# Patient Record
Sex: Male | Born: 1985 | Race: Black or African American | Hispanic: No | Marital: Single | Smoking: Former smoker
Health system: Southern US, Community
[De-identification: ages and names within clinical notes are randomized; demographics above are authoritative.]

---

## 2007-03-24 ENCOUNTER — Emergency Department (HOSPITAL_COMMUNITY): Admission: EM | Admit: 2007-03-24 | Discharge: 2007-03-24 | Payer: Self-pay | Admitting: Emergency Medicine

## 2007-03-29 ENCOUNTER — Emergency Department (HOSPITAL_COMMUNITY): Admission: EM | Admit: 2007-03-29 | Discharge: 2007-03-29 | Payer: Self-pay | Admitting: Family Medicine

## 2011-08-30 ENCOUNTER — Encounter (HOSPITAL_COMMUNITY): Payer: Self-pay | Admitting: Emergency Medicine

## 2011-08-30 ENCOUNTER — Emergency Department (HOSPITAL_COMMUNITY)
Admission: EM | Admit: 2011-08-30 | Discharge: 2011-08-30 | Disposition: A | Discharge status: Home / Self Care | Payer: Self-pay | Attending: Emergency Medicine | Admitting: Emergency Medicine

## 2011-08-30 DIAGNOSIS — Z23 Encounter for immunization: Secondary | ICD-10-CM | POA: Insufficient documentation

## 2011-08-30 DIAGNOSIS — W268XXA Contact with other sharp object(s), not elsewhere classified, initial encounter: Secondary | ICD-10-CM | POA: Insufficient documentation

## 2011-08-30 DIAGNOSIS — T148XXA Other injury of unspecified body region, initial encounter: Secondary | ICD-10-CM

## 2011-08-30 DIAGNOSIS — Y92009 Unspecified place in unspecified non-institutional (private) residence as the place of occurrence of the external cause: Secondary | ICD-10-CM | POA: Insufficient documentation

## 2011-08-30 DIAGNOSIS — S0100XA Unspecified open wound of scalp, initial encounter: Secondary | ICD-10-CM | POA: Insufficient documentation

## 2011-08-30 MED ORDER — TETANUS-DIPHTH-ACELL PERTUSSIS 5-2.5-18.5 LF-MCG/0.5 IM SUSP
0.5000 mL | Freq: Once | INTRAMUSCULAR | Status: AC
  Administered 2011-08-30: 0.5 mL via INTRAMUSCULAR
  Filled 2011-08-30: qty 0.5

## 2011-08-30 NOTE — ED Notes (Signed)
Pt climbing in attic and hit head on a nail. No obvious puncture wound noted.

## 2011-08-30 NOTE — Discharge Instructions (Signed)
You do not have a significant in jury.  We have updated your tetanus shot. You can wash your scalp with shampoo as usual.  You have no restrictions.

## 2011-08-30 NOTE — ED Provider Notes (Signed)
History   This chart was scribed for Rick Guppy, MD by Clarita Crane. The patient was seen in room APA04/APA04. Patient's care was started at 0909.    CSN: [id number]  Arrival date & time 08/30/11  0909   First MD Initiated Contact with Patient 08/30/11 [phone]      Chief Complaint  Patient presents with  . Head Injury    hit nail in attic on top of head.    (Consider location/radiation/quality/duration/timing/severity/associated sxs/prior treatment) HPI Rick Estrada is a 26 y.o. male who presents to the Emergency Department complaining of a mild head injury sustained this morning while patient was walking through attic and struck his head on a nail. Patient reports sustaining small laceration to top of head but notes bleeding was minimal. Denies LOC, nausea, vomiting, HA. States tetanus not UTD.   History reviewed. No pertinent past medical history.  History reviewed. No pertinent past surgical history.  History reviewed. No pertinent family history.  History  Substance Use Topics  . Smoking status: Not on file  . Smokeless tobacco: Not on file  . Alcohol Use: Yes     occ      Review of Systems A complete 10 system review of systems was obtained and all systems are negative except as noted in the HPI and PMH.   Allergies  Review of patient's allergies indicates no known allergies.  Home Medications  No current outpatient prescriptions on file.  BP 155/58  Pulse 62  Temp(Src) 97.8 F (36.6 C) (Oral)  Resp 18  Ht 6\' 3"  (1.905 m)  Wt 204 lb (92.534 kg)  BMI 25.50 kg/m2  SpO2 100%  Physical Exam  Nursing note and vitals reviewed. Constitutional: He is oriented to person, place, and time. He appears well-developed and well-nourished. No distress.  HENT:  Head: Normocephalic and atraumatic.       2mm laceration to top of scalp.   Eyes: EOM are normal. Pupils are equal, round, and reactive to light.  Neck: Neck supple. No tracheal deviation present.    Cardiovascular: Normal rate.   Pulmonary/Chest: Effort normal. No respiratory distress.  Abdominal: Soft. He exhibits no distension.  Musculoskeletal: Normal range of motion. He exhibits no edema.  Neurological: He is alert and oriented to person, place, and time. No sensory deficit.  Skin: Skin is warm and dry.  Psychiatric: He has a normal mood and affect. His behavior is normal.    ED Course  Procedures (including critical care time)  DIAGNOSTIC STUDIES: Oxygen Saturation is 100% on room air, normal by my interpretation.    COORDINATION OF CARE: 9:36AM-Patient informed of current plan for treatment and evaluation and agrees with plan at this time. Will administer tetanus shot and d/c home.   Labs Reviewed - No data to display No results found.   No diagnosis found.    MDM  Tiny puncture wound to scalp.  No significant injury Tetanus updated      I personally performed the services described in this documentation, which was scribed in my presence. The recorded information has been reviewed and considered.    Rick Guppy, MD 08/30/11 [phone]

## 2013-08-08 ENCOUNTER — Encounter (HOSPITAL_COMMUNITY): Payer: Self-pay | Admitting: Emergency Medicine

## 2013-08-08 ENCOUNTER — Emergency Department (HOSPITAL_COMMUNITY)
Admission: EM | Admit: 2013-08-08 | Discharge: 2013-08-08 | Disposition: A | Discharge status: Home / Self Care | Payer: No Typology Code available for payment source | Attending: Emergency Medicine | Admitting: Emergency Medicine

## 2013-08-08 ENCOUNTER — Emergency Department (HOSPITAL_COMMUNITY): Payer: No Typology Code available for payment source

## 2013-08-08 DIAGNOSIS — Y92410 Unspecified street and highway as the place of occurrence of the external cause: Secondary | ICD-10-CM | POA: Insufficient documentation

## 2013-08-08 DIAGNOSIS — S8992XA Unspecified injury of left lower leg, initial encounter: Secondary | ICD-10-CM

## 2013-08-08 DIAGNOSIS — S99929A Unspecified injury of unspecified foot, initial encounter: Principal | ICD-10-CM

## 2013-08-08 DIAGNOSIS — Y9389 Activity, other specified: Secondary | ICD-10-CM | POA: Insufficient documentation

## 2013-08-08 DIAGNOSIS — S8990XA Unspecified injury of unspecified lower leg, initial encounter: Secondary | ICD-10-CM | POA: Insufficient documentation

## 2013-08-08 DIAGNOSIS — S99919A Unspecified injury of unspecified ankle, initial encounter: Principal | ICD-10-CM

## 2013-08-08 MED ORDER — IBUPROFEN 400 MG PO TABS
800.0000 mg | ORAL_TABLET | Freq: Once | ORAL | Status: AC
  Administered 2013-08-08: 800 mg via ORAL
  Filled 2013-08-08: qty 2

## 2013-08-08 MED ORDER — IBUPROFEN 800 MG PO TABS: 800.0000 mg | ORAL_TABLET | Freq: Three times a day (TID) | ORAL | Status: DC
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
## 2013-08-08 NOTE — ED Notes (Signed)
Patient remains in radiology
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
## 2013-08-08 NOTE — Discharge Instructions (Signed)
Take Ibuprofen as needed for pain. Refer to attached documents for more information.  °

## 2013-08-09 ENCOUNTER — Encounter (HOSPITAL_COMMUNITY): Payer: Self-pay | Admitting: Emergency Medicine

## 2013-08-09 ENCOUNTER — Emergency Department (HOSPITAL_COMMUNITY)
Admission: EM | Admit: 2013-08-09 | Discharge: 2013-08-09 | Disposition: A | Discharge status: Home / Self Care | Payer: No Typology Code available for payment source | Attending: Emergency Medicine | Admitting: Emergency Medicine

## 2013-08-09 DIAGNOSIS — IMO0002 Reserved for concepts with insufficient information to code with codable children: Secondary | ICD-10-CM | POA: Insufficient documentation

## 2013-08-09 DIAGNOSIS — Y9389 Activity, other specified: Secondary | ICD-10-CM | POA: Insufficient documentation

## 2013-08-09 DIAGNOSIS — M542 Cervicalgia: Secondary | ICD-10-CM

## 2013-08-09 DIAGNOSIS — S0993XA Unspecified injury of face, initial encounter: Secondary | ICD-10-CM | POA: Insufficient documentation

## 2013-08-09 DIAGNOSIS — Y92410 Unspecified street and highway as the place of occurrence of the external cause: Secondary | ICD-10-CM | POA: Insufficient documentation

## 2013-08-09 DIAGNOSIS — S199XXA Unspecified injury of neck, initial encounter: Principal | ICD-10-CM

## 2013-08-09 DIAGNOSIS — Z791 Long term (current) use of non-steroidal anti-inflammatories (NSAID): Secondary | ICD-10-CM | POA: Insufficient documentation

## 2013-08-09 DIAGNOSIS — Z87891 Personal history of nicotine dependence: Secondary | ICD-10-CM | POA: Insufficient documentation

## 2013-08-09 MED ORDER — CYCLOBENZAPRINE HCL 10 MG PO TABS: 10.0000 mg | ORAL_TABLET | Freq: Two times a day (BID) | ORAL | Status: DC | PRN
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
## 2013-08-09 NOTE — Discharge Instructions (Signed)
Take Flexeril as needed for muscle spasm. Apply heat to the affected area. Refer to attached documents for more information.

## 2013-08-10 NOTE — ED Provider Notes (Signed)
Medical screening examination/treatment/procedure(s) were performed by non-physician practitioner and as supervising physician I was immediately available for consultation/collaboration.   EKG Interpretation None       Hurman HornJohn M Lynze Reddy, MD 08/10/13 1341

## 2013-08-13 NOTE — ED Provider Notes (Signed)
Medical screening examination/treatment/procedure(s) were performed by non-physician practitioner and as supervising physician I was immediately available for consultation/collaboration.   EKG Interpretation None        Rosary Filosa, MD 08/13/13 2138 

## 2013-12-01 ENCOUNTER — Emergency Department (HOSPITAL_COMMUNITY)
Admission: EM | Admit: 2013-12-01 | Discharge: 2013-12-01 | Discharge status: Against Medical Advice | Payer: No Typology Code available for payment source | Attending: Emergency Medicine | Admitting: Emergency Medicine

## 2013-12-01 ENCOUNTER — Encounter (HOSPITAL_COMMUNITY): Payer: Self-pay | Admitting: Emergency Medicine

## 2013-12-01 DIAGNOSIS — S46909A Unspecified injury of unspecified muscle, fascia and tendon at shoulder and upper arm level, unspecified arm, initial encounter: Secondary | ICD-10-CM | POA: Insufficient documentation

## 2013-12-01 DIAGNOSIS — Y92410 Unspecified street and highway as the place of occurrence of the external cause: Secondary | ICD-10-CM | POA: Insufficient documentation

## 2013-12-01 DIAGNOSIS — Y9389 Activity, other specified: Secondary | ICD-10-CM | POA: Insufficient documentation

## 2013-12-01 DIAGNOSIS — S4980XA Other specified injuries of shoulder and upper arm, unspecified arm, initial encounter: Secondary | ICD-10-CM | POA: Insufficient documentation

## 2013-12-01 NOTE — ED Provider Notes (Signed)
Medical screening examination/treatment/procedure(s) were performed by non-physician practitioner and as supervising physician I was immediately available for consultation/collaboration.   EKG Interpretation None        Christ Fullenwider N Josuha Fontanez, DO 12/01/13 2315 
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
## 2014-10-17 ENCOUNTER — Emergency Department (HOSPITAL_COMMUNITY)
Admission: EM | Admit: 2014-10-17 | Discharge: 2014-10-17 | Disposition: A | Discharge status: Home / Self Care | Payer: Self-pay | Attending: Emergency Medicine | Admitting: Emergency Medicine

## 2014-10-17 ENCOUNTER — Encounter (HOSPITAL_COMMUNITY): Payer: Self-pay | Admitting: *Deleted

## 2014-10-17 DIAGNOSIS — Z87891 Personal history of nicotine dependence: Secondary | ICD-10-CM | POA: Insufficient documentation

## 2014-10-17 DIAGNOSIS — Z4802 Encounter for removal of sutures: Secondary | ICD-10-CM | POA: Insufficient documentation

## 2014-10-17 NOTE — ED Notes (Signed)
Pt needs to have sutures removed from 12 days ago.

## 2014-10-17 NOTE — ED Notes (Signed)
NP at bedside.
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
## 2014-10-17 NOTE — Discharge Instructions (Signed)

## 2016-01-03 ENCOUNTER — Encounter (HOSPITAL_COMMUNITY): Payer: Self-pay | Admitting: *Deleted

## 2016-01-03 ENCOUNTER — Emergency Department (HOSPITAL_COMMUNITY): Payer: No Typology Code available for payment source

## 2016-01-03 ENCOUNTER — Emergency Department (HOSPITAL_COMMUNITY)
Admission: EM | Admit: 2016-01-03 | Discharge: 2016-01-03 | Disposition: A | Discharge status: Home / Self Care | Payer: No Typology Code available for payment source | Attending: Emergency Medicine | Admitting: Emergency Medicine

## 2016-01-03 DIAGNOSIS — S8992XA Unspecified injury of left lower leg, initial encounter: Secondary | ICD-10-CM | POA: Diagnosis present

## 2016-01-03 DIAGNOSIS — Y939 Activity, unspecified: Secondary | ICD-10-CM | POA: Diagnosis not present

## 2016-01-03 DIAGNOSIS — M25562 Pain in left knee: Secondary | ICD-10-CM

## 2016-01-03 DIAGNOSIS — Z87891 Personal history of nicotine dependence: Secondary | ICD-10-CM | POA: Diagnosis not present

## 2016-01-03 DIAGNOSIS — Y999 Unspecified external cause status: Secondary | ICD-10-CM | POA: Diagnosis not present

## 2016-01-03 DIAGNOSIS — Y92410 Unspecified street and highway as the place of occurrence of the external cause: Secondary | ICD-10-CM | POA: Diagnosis not present

## 2016-01-03 DIAGNOSIS — M542 Cervicalgia: Secondary | ICD-10-CM | POA: Diagnosis not present

## 2016-01-03 MED ORDER — IBUPROFEN 800 MG PO TABS: 800.0000 mg | ORAL_TABLET | Freq: Three times a day (TID) | ORAL | 0 refills | Status: AC
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
## 2016-01-03 NOTE — Progress Notes (Signed)
Orthopedic Tech Progress Note Patient Details:  Rick StarrFloyd Estrada 02/19/1986 [id number]  Ortho Devices Type of Ortho Device: Knee Sleeve Ortho Device/Splint Interventions: Application   Saul FordyceJennifer C Faithe Ariola 01/03/2016, 1:23 PM

## 2016-01-03 NOTE — ED Notes (Signed)
Declined W/C at D/C and was escorted to lobby by RN. 
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
